# Patient Record
Sex: Male | Born: 1975 | Race: White | Hispanic: Yes | Smoking: Current every day smoker
Health system: Southern US, Community
[De-identification: ages and names within clinical notes are randomized; demographics above are authoritative.]

---

## 2010-10-25 ENCOUNTER — Emergency Department (HOSPITAL_COMMUNITY)
Admission: EM | Admit: 2010-10-25 | Discharge: 2010-10-25 | Payer: Self-pay | Source: Home / Self Care | Admitting: Emergency Medicine

## 2013-05-25 ENCOUNTER — Ambulatory Visit: Payer: Self-pay | Admitting: Family Medicine

## 2013-05-25 ENCOUNTER — Ambulatory Visit: Payer: Self-pay

## 2013-05-25 VITALS — BP 122/74 | HR 61 | Temp 98.1°F | Resp 18 | Ht 64.0 in | Wt 173.0 lb

## 2013-05-25 DIAGNOSIS — M79609 Pain in unspecified limb: Secondary | ICD-10-CM

## 2013-05-25 DIAGNOSIS — M79644 Pain in right finger(s): Secondary | ICD-10-CM

## 2013-05-25 DIAGNOSIS — S61011A Laceration without foreign body of right thumb without damage to nail, initial encounter: Secondary | ICD-10-CM

## 2013-05-25 DIAGNOSIS — S61209A Unspecified open wound of unspecified finger without damage to nail, initial encounter: Secondary | ICD-10-CM

## 2013-05-25 NOTE — Progress Notes (Signed)
   [address] [place] [address]   Phone [phone]  Subjective:    Patient ID: Jonathan Singleton, male    DOB: 1976-08-14, 37 y.o.   MRN: [id number]  HPI Pt presents to clinic with laceration to R thumb lateral edge near the nail from an electric knife while he was cooking.  His last tetanus was 6 months ago.  He has pain all over the thumb.   Review of Systems  Skin: Positive for wound.       Objective:   Physical Exam  Vitals reviewed. Constitutional: He is oriented to person, place, and time. He appears well-developed and well-nourished.  HENT:  Head: Normocephalic and atraumatic.  Right Ear: External ear normal.  Left Ear: External ear normal.  Eyes: Conjunctivae are normal.  Neck: Normal range of motion.  Pulmonary/Chest: Effort normal.  Neurological: He is alert and oriented to person, place, and time.  Skin: Skin is warm and dry.  Sensation at tip of finger intact.  Pt can move this thumb but he has pain everywhere.  Good strength but he has pain all over the thumb.  Psychiatric: He has a normal mood and affect. His behavior is normal. Judgment and thought content normal.   Procedure: Consent obtained.  Digital block with 2% lido.  Anesthesia obtained.  Wound cleaned.  Lateral proximal corner of nail was cut through and it was removed.  Hemostasis achieved with penrose drain.  Wound was explored - the area in the nail was the deepest.  5-0 Ethilon #5 SI sutures were placed on the tissue surrounding the nail and there were no sutures placed into the nail because the wound was stable. Drsg was placed.  [facility] reading (PRIMARY) by  Dr. Conley Rolls.  No bony abnormality.     Assessment & Plan:  Wound thumb -  Pain thumb -   SR in 7 days.  Wound car d/w pt.  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  Benny Lennert PA-C 05/25/2013 6:01 PM

## 2013-05-25 NOTE — Patient Instructions (Addendum)
Ok to take motrin 800mg  3x day (that is 4 over the counter motrin)   CUIDADO DE LA HERIDA (Wound Care) Por favor vuelta en 7 das para hacer sus puntadas/ grapas quitar o ms pronto si usted tiene preocupaciones. Shan Levans el rea limpia y seca por 24 horas. No quite el vendaje, si est aplicado. Marland Kitchen Despus de 24 horas, quita el vendaje y limpia la herida suavemente con cualquier tipo de [place] y agua tibia. Reaplique un vendaje nuevo despus de limpiar herida, si est dirigido. . Limpie la herida con el jabn y agua 1 a 2 veces al Holiday representative se quitan las puntadas. . No aplique ninguna ungentos ni cremas a la herida [employer] las puntadas estn en lugar, pues sta puede causar curativo retrasado. . Notifique la oficina si usted tiene el siguiente de los muestras de la infeccin: Hinchazn, enrojecimiento, calor, drenaje del pus, de la fiebre > 101.0 F . Notifique la oficina si usted tiene la sangra excesiva eso no para despus de 15-20 minutos de presin firme y Risk analyst.

## 2017-08-14 ENCOUNTER — Emergency Department (HOSPITAL_COMMUNITY): Payer: Self-pay

## 2017-08-14 ENCOUNTER — Emergency Department (HOSPITAL_COMMUNITY)
Admission: EM | Admit: 2017-08-14 | Discharge: 2017-08-14 | Disposition: A | Discharge status: Home / Self Care | Payer: Self-pay | Attending: Emergency Medicine | Admitting: Emergency Medicine

## 2017-08-14 ENCOUNTER — Other Ambulatory Visit: Payer: Self-pay

## 2017-08-14 ENCOUNTER — Encounter (HOSPITAL_COMMUNITY): Payer: Self-pay | Admitting: Emergency Medicine

## 2017-08-14 DIAGNOSIS — R079 Chest pain, unspecified: Secondary | ICD-10-CM

## 2017-08-14 DIAGNOSIS — F17200 Nicotine dependence, unspecified, uncomplicated: Secondary | ICD-10-CM | POA: Insufficient documentation

## 2017-08-14 DIAGNOSIS — M25512 Pain in left shoulder: Secondary | ICD-10-CM | POA: Insufficient documentation

## 2017-08-14 LAB — BASIC METABOLIC PANEL
ANION GAP: 7 (ref 5–15)
BUN: 14 mg/dL (ref 6–20)
CALCIUM: 8.9 mg/dL (ref 8.9–10.3)
CO2: 23 mmol/L (ref 22–32)
Chloride: 106 mmol/L (ref 101–111)
Creatinine, Ser: 0.84 mg/dL (ref 0.61–1.24)
Glucose, Bld: 101 mg/dL — ABNORMAL HIGH (ref 65–99)
Potassium: 3.6 mmol/L (ref 3.5–5.1)
SODIUM: 136 mmol/L (ref 135–145)

## 2017-08-14 LAB — CBC
HCT: 45.6 % (ref 39.0–52.0)
HEMOGLOBIN: 15.8 g/dL (ref 13.0–17.0)
MCH: 30.4 pg (ref 26.0–34.0)
MCHC: 34.6 g/dL (ref 30.0–36.0)
MCV: 87.7 fL (ref 78.0–100.0)
Platelets: 175 10*3/uL (ref 150–400)
RBC: 5.2 MIL/uL (ref 4.22–5.81)
RDW: 12.7 % (ref 11.5–15.5)
WBC: 6.4 10*3/uL (ref 4.0–10.5)

## 2017-08-14 LAB — I-STAT TROPONIN, ED: TROPONIN I, POC: 0 ng/mL (ref 0.00–0.08)

## 2017-08-14 MED ORDER — CYCLOBENZAPRINE HCL 10 MG PO TABS: 10.0000 mg | ORAL_TABLET | Freq: Two times a day (BID) | ORAL | 0 refills | Status: AC | PRN

## 2017-08-14 MED ORDER — IBUPROFEN 800 MG PO TABS: 800.0000 mg | ORAL_TABLET | Freq: Three times a day (TID) | ORAL | 0 refills | Status: AC

## 2017-08-14 NOTE — ED Provider Notes (Signed)
[facility] EMERGENCY DEPARTMENT Provider Note   CSN: [id number] Arrival date & time: 08/14/17  1954     History   Chief Complaint Chief Complaint  Patient presents with  . Chest Pain    HPI Jonathan Singleton is a 41 y.o. male.  Patient presents to the ED with a chief complaint of chest pain and left shoulder pain times 15 days.  He states that it has been gradually worsening.  He states that the pain is reproducible with palpation and also with deep inspiration.  He reports having some numbness and tingling into his left arm and hand.  He denies any weakness.  He denies any fevers, chills, cough, or shortness of breath.  He has not taken anything for symptoms.  There are no other associated symptoms or modifying factors.  He has not taken anything for his symptoms.   The history is provided by the patient. No language interpreter was used.    History reviewed. No pertinent past medical history.  There are no active problems to display for this patient.   History reviewed. No pertinent surgical history.     Home Medications    Prior to Admission medications   Medication Sig Start Date End Date Taking? Authorizing Provider  cyclobenzaprine (FLEXERIL) 10 MG tablet Take 1 tablet (10 mg total) by mouth 2 (two) times daily as needed for muscle spasms. 08/14/17   Roxy Horseman, PA-C  ibuprofen (ADVIL,MOTRIN) 800 MG tablet Take 1 tablet (800 mg total) by mouth 3 (three) times daily. 08/14/17   Roxy Horseman, PA-C    Family History No family history on file.  Social History Social History  Substance Use Topics  . Smoking status: Current Every Day Smoker  . Smokeless tobacco: Never Used  . Alcohol use No     Allergies   Patient has no known allergies.   Review of Systems Review of Systems  All other systems reviewed and are negative.    Physical Exam Updated Vital Signs BP (!) 136/93   Pulse 64   Temp 98.2 F (36.8 C) (Oral)    Resp 17   SpO2 99%   Physical Exam  Constitutional: He is oriented to person, place, and time. He appears well-developed and well-nourished.  HENT:  Head: Normocephalic and atraumatic.  Eyes: Pupils are equal, round, and reactive to light. Conjunctivae and EOM are normal. Right eye exhibits no discharge. Left eye exhibits no discharge. No scleral icterus.  Neck: Normal range of motion. Neck supple. No JVD present.  Cardiovascular: Normal rate, regular rhythm and normal heart sounds.  Exam reveals no gallop and no friction rub.   No murmur heard. Pulmonary/Chest: Effort normal and breath sounds normal. No respiratory distress. He has no wheezes. He has no rales. He exhibits no tenderness.  Abdominal: Soft. He exhibits no distension and no mass. There is no tenderness. There is no rebound and no guarding.  Musculoskeletal: Normal range of motion. He exhibits no edema or tenderness.  Neurological: He is alert and oriented to person, place, and time.  Skin: Skin is warm and dry.  Psychiatric: He has a normal mood and affect. His behavior is normal. Judgment and thought content normal.  Nursing note and vitals reviewed.    ED Treatments / Results  Labs (all labs ordered are listed, but only abnormal results are displayed) Labs Reviewed  BASIC METABOLIC PANEL - Abnormal; Notable for the following:       Result Value   Glucose, Bld 101 (*)  All other components within normal limits  CBC  I-STAT TROPONIN, ED    EKG  EKG Interpretation None       Radiology Dg Chest 2 View  Result Date: 08/14/2017 CLINICAL DATA:  Left-sided chest pain EXAM: CHEST  2 VIEW COMPARISON:  None. FINDINGS: The heart size and mediastinal contours are within normal limits. Both lungs are clear. The visualized skeletal structures are unremarkable. IMPRESSION: No active cardiopulmonary disease. Electronically Signed   By: Jasmine PangKim  Fujinaga M.D.   On: 08/14/2017 21:10    Procedures Procedures (including  critical care time)  Medications Ordered in ED Medications - No data to display   Initial Impression / Assessment and Plan / ED Course  I have reviewed the triage vital signs and the nursing notes.  Pertinent labs & imaging results that were available during my care of the patient were reviewed by me and considered in my medical decision making (see chart for details).     Patient with left shoulder and chest pain.  Pain is easily reproducible with palpation.  Has some intermittent paresthesias in his left arm and hand, likely due to nerve compression vs cervical radiculopathy.  Labs are unremarkable.  CXR is normal. No ischemic findings on EKG.  Will treat for MSK pain with NSAIDs and flexeril.  Final Clinical Impressions(s) / ED Diagnoses   Final diagnoses:  Acute pain of left shoulder  Nonspecific chest pain    New Prescriptions New Prescriptions   CYCLOBENZAPRINE (FLEXERIL) 10 MG TABLET    Take 1 tablet (10 mg total) by mouth 2 (two) times daily as needed for muscle spasms.   IBUPROFEN (ADVIL,MOTRIN) 800 MG TABLET    Take 1 tablet (800 mg total) by mouth 3 (three) times daily.     Roxy HorsemanBrowning, Bionca Mckey, PA-C 08/14/17 [id number]    Gilda CreasePollina, Christopher J, MD 08/15/17 [phone]

## 2017-08-14 NOTE — ED Notes (Signed)
Pt needs translator for spanish.

## 2017-08-14 NOTE — ED Triage Notes (Signed)
PT c/o left sided chest pain that radiates to the left arm, jaw, and back that started 15 days ago, worsening today. Denies nausea/vomiting, states it sometimes feels hard to breath.

## 2017-08-14 NOTE — ED Notes (Signed)
PT states understanding of care given, follow up care, and medication prescribed. PT ambulated from ED to car with a steady gait. 

## 2018-06-21 IMAGING — CR DG CHEST 2V
2 series · 2 of 2 positions shown · non-contrast
Comparison: None.

CLINICAL DATA: Left-sided chest pain

EXAM:
CHEST  2 VIEW

[chest pa]
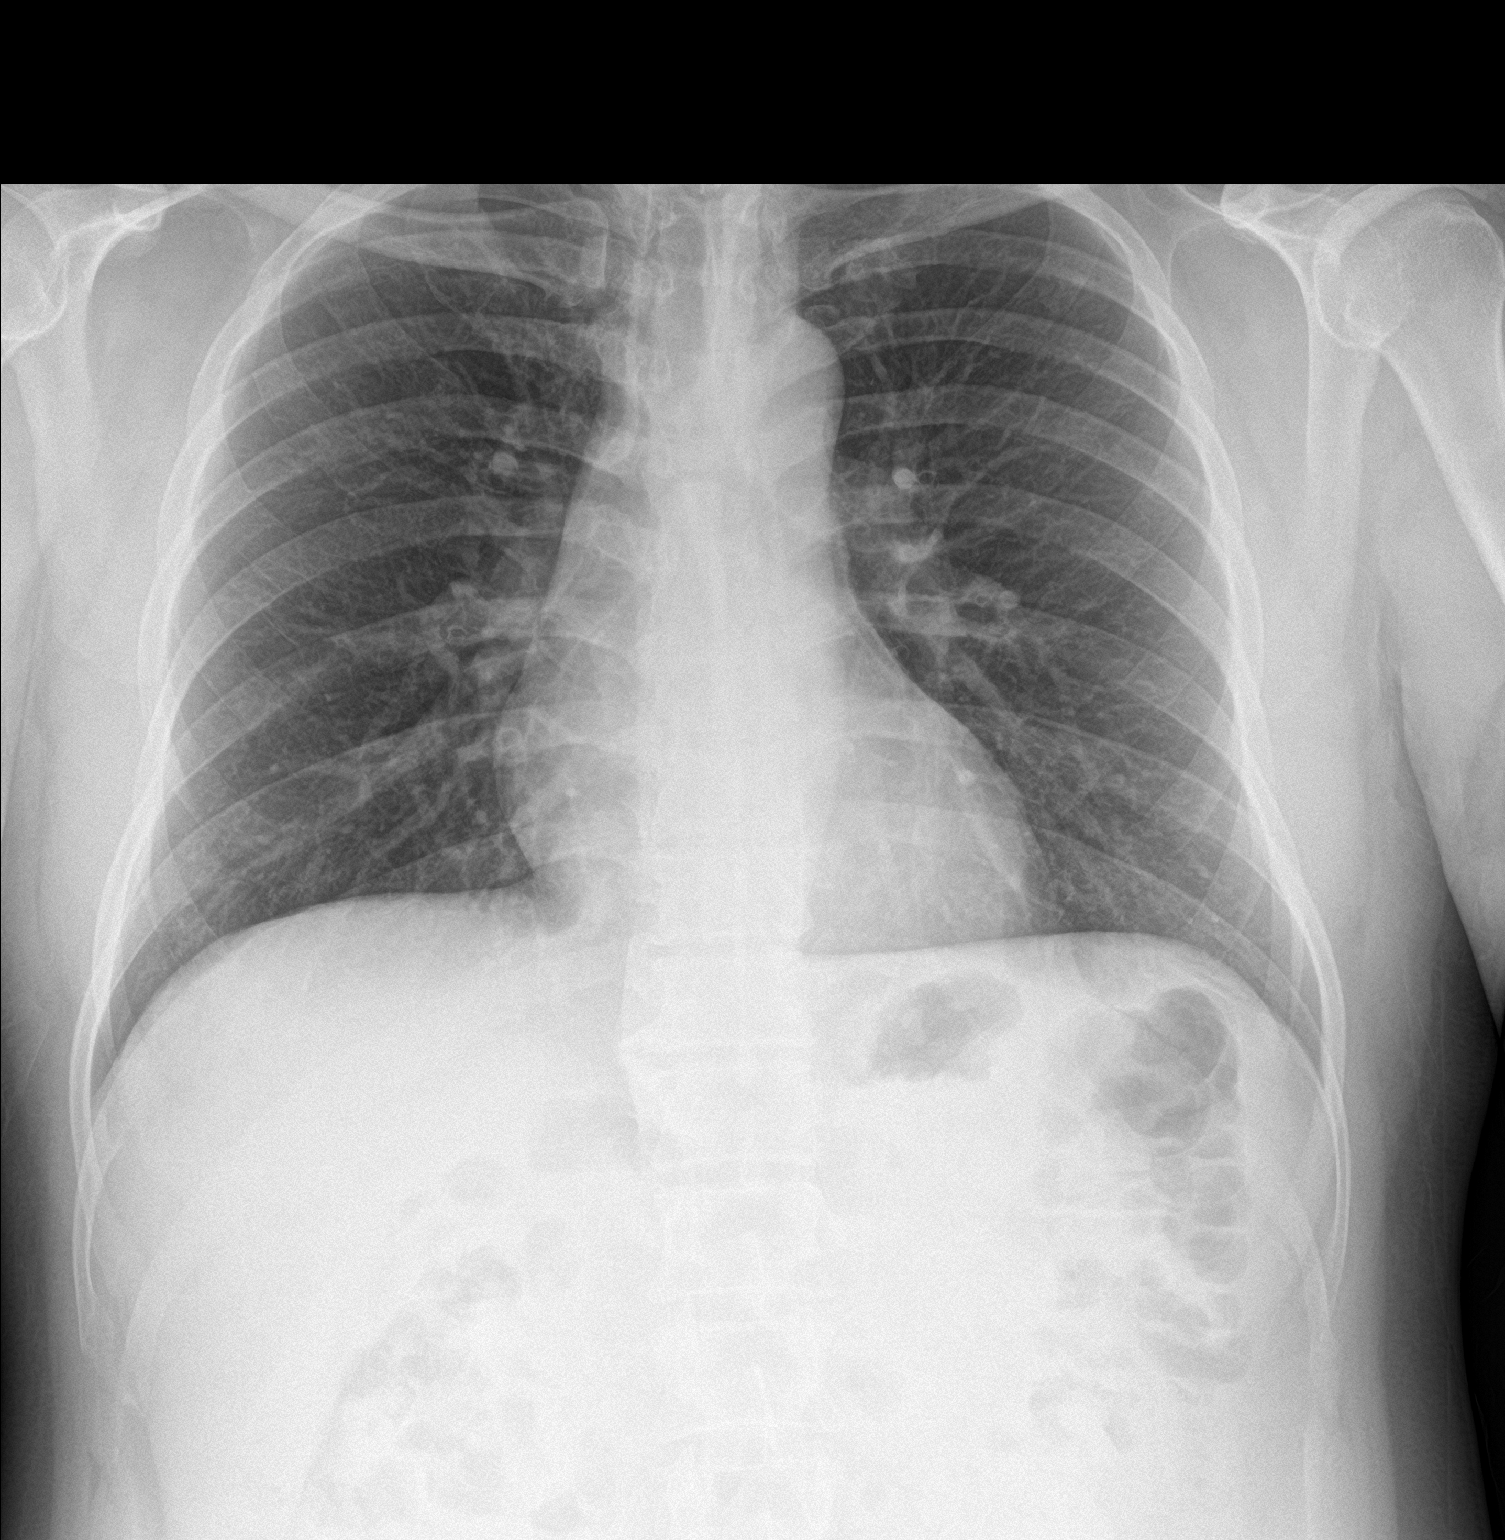

[chest lat]
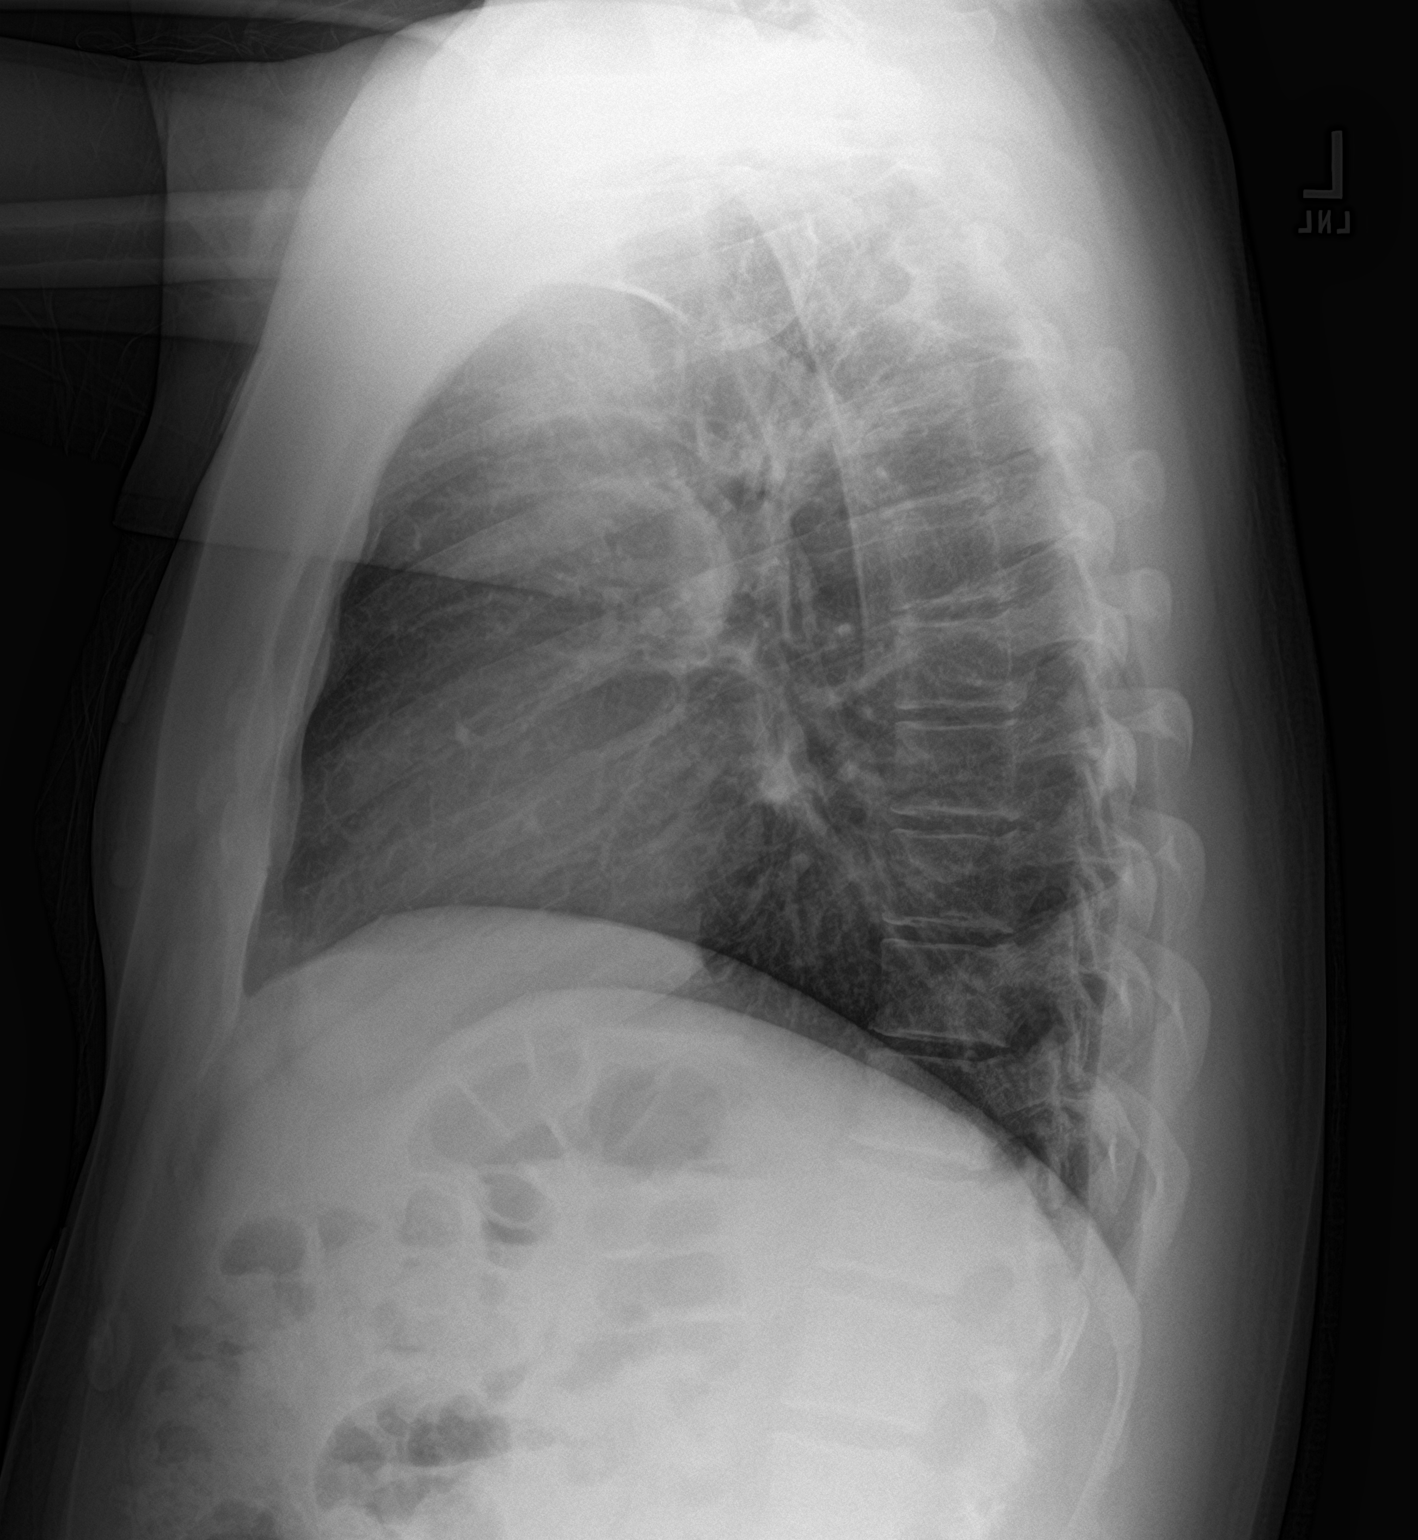

[2 of 2 positions shown; findings below may reference images not displayed]

FINDINGS: The heart size and mediastinal contours are within normal limits.
Both lungs are clear. The visualized skeletal structures are
unremarkable.
IMPRESSION: No active cardiopulmonary disease.
